# Patient Record
Sex: Female | Born: 1970 | Race: White | Hispanic: No | Marital: Married | Smoking: Former smoker
Health system: Southern US, Community
[De-identification: ages and names within clinical notes are randomized; demographics above are authoritative.]

## PROBLEM LIST (undated history)

## (undated) DIAGNOSIS — I10 Essential (primary) hypertension: Secondary | ICD-10-CM

## (undated) HISTORY — PX: ABLATION: SHX5711

---

## 2000-06-07 ENCOUNTER — Other Ambulatory Visit: Admission: RE | Admit: 2000-06-07 | Discharge: 2000-06-07 | Payer: Self-pay | Admitting: Obstetrics & Gynecology

## 2001-01-03 ENCOUNTER — Inpatient Hospital Stay (HOSPITAL_COMMUNITY): Admission: AD | Admit: 2001-01-03 | Discharge: 2001-01-06 | Payer: Self-pay | Admitting: Obstetrics and Gynecology

## 2004-01-05 ENCOUNTER — Other Ambulatory Visit: Admission: RE | Admit: 2004-01-05 | Discharge: 2004-01-05 | Payer: Self-pay | Admitting: Obstetrics and Gynecology

## 2005-01-18 ENCOUNTER — Other Ambulatory Visit: Admission: RE | Admit: 2005-01-18 | Discharge: 2005-01-18 | Payer: Self-pay | Admitting: Obstetrics and Gynecology

## 2006-03-06 ENCOUNTER — Other Ambulatory Visit: Admission: RE | Admit: 2006-03-06 | Discharge: 2006-03-06 | Payer: Self-pay | Admitting: Obstetrics and Gynecology

## 2007-12-28 ENCOUNTER — Encounter: Admission: RE | Admit: 2007-12-28 | Discharge: 2007-12-28 | Payer: Self-pay | Admitting: Obstetrics and Gynecology

## 2008-04-11 ENCOUNTER — Encounter (INDEPENDENT_AMBULATORY_CARE_PROVIDER_SITE_OTHER): Payer: Self-pay | Admitting: Obstetrics and Gynecology

## 2008-04-11 ENCOUNTER — Ambulatory Visit (HOSPITAL_COMMUNITY): Admission: RE | Admit: 2008-04-11 | Discharge: 2008-04-11 | Payer: Self-pay | Admitting: Obstetrics and Gynecology

## 2011-01-10 ENCOUNTER — Other Ambulatory Visit: Payer: Self-pay | Admitting: Internal Medicine

## 2011-01-10 DIAGNOSIS — Z1231 Encounter for screening mammogram for malignant neoplasm of breast: Secondary | ICD-10-CM

## 2011-01-19 ENCOUNTER — Ambulatory Visit
Admission: RE | Admit: 2011-01-19 | Discharge: 2011-01-19 | Disposition: A | Discharge status: Home / Self Care | Payer: 59 | Source: Ambulatory Visit | Attending: *Deleted | Admitting: *Deleted

## 2011-01-19 DIAGNOSIS — Z1231 Encounter for screening mammogram for malignant neoplasm of breast: Secondary | ICD-10-CM

## 2011-01-21 ENCOUNTER — Other Ambulatory Visit: Payer: Self-pay | Admitting: *Deleted

## 2011-01-21 ENCOUNTER — Other Ambulatory Visit: Payer: Self-pay | Admitting: Internal Medicine

## 2011-01-21 DIAGNOSIS — Z1231 Encounter for screening mammogram for malignant neoplasm of breast: Secondary | ICD-10-CM

## 2011-01-24 ENCOUNTER — Other Ambulatory Visit: Payer: Self-pay | Admitting: Internal Medicine

## 2011-01-24 ENCOUNTER — Other Ambulatory Visit: Payer: Self-pay | Admitting: Family Medicine

## 2011-01-24 DIAGNOSIS — Z1231 Encounter for screening mammogram for malignant neoplasm of breast: Secondary | ICD-10-CM

## 2011-01-25 NOTE — Op Note (Signed)
NAME:  Valerie Burns, Valerie Burns                ACCOUNT NO.:  [id number]   MEDICAL RECORD NO.:  [id number]          PATIENT TYPE:  AMB   LOCATION:  SDC                           FACILITY:  [facility]   PHYSICIAN:  Naima A. Dillard, M.D. DATE OF BIRTH:  1970-10-02   DATE OF PROCEDURE:  04/11/2008  DATE OF DISCHARGE:                               OPERATIVE REPORT   PREOPERATIVE DIAGNOSIS:  Endometrial polyp and menorrhagia.   POSTOPERATIVE DIAGNOSIS:  Endometrial polyp and menorrhagia.   PROCEDURES:  1. Dilation and curettage.  2. Hysteroscopy.  3. Resection of polyp.   SURGEON:  Naima A. Dillard, MD   ASSISTANT:  None.   ANESTHESIA:  General and local.   SPECIMENS:  Endometrial polyp and endometrial curettings sent to  pathology.  Deficit was 315 mL with about 100 mL on the floor.   ESTIMATED BLOOD LOSS:  Minimal.   COMPLICATIONS:  None.   The patient went to PACU in stable condition.   PROCEDURE IN DETAIL:  The patient was taken to the operating room where  she was given general anesthesia, placed in dorsal lithotomy position,  and prepped and draped in a normal sterile fashion.  Straight catheter  was used to drain the bladder.  A bivalve speculum was placed into the  vagina.  The anterior lip of the cervix was grasped with single-tooth  tenaculum.  The cervix was infiltrated with 10 mL of 1% lidocaine with  cervical block.  Cervix was then dilated with Pratt dilators up to 21.  The hysteroscope was placed into the uterine cavity and a large polyp  was seen in the right lower quadrant just below the ostia.  The  patient's right side of the uterus, there were no submucosal fibroids or  other polyps seen.  Polyp forceps were placed into the uterine cavity,  but I was unable to grasp the polyp, so it did dilate the cervix further  with [facility] dilators and the resectoscope was used to remove the polyp in  its entirety without any difficulty, then a sharp curettage was done.  We first used  D5W, then we rinsed the uterine cavity with sorbitol  before the resection, and then after the resection was complete, we re-  rinsed the cavity with D5W.  I then did the ThermaChoice ablation, the  uterus did sound to 10 cm and that was done per protocol without any  difficulty.  Once the ThermaChoice ablation was done, I did look one  more time, there were no  perforations, the entire fundal cavity had been ablated.  There were no  pink areas and the polyp had been completely resected.  Instruments were  removed from the uterus and cervix and vagina with good hemostasis noted  at the tenaculum site.  Sponge, lap, and needle counts were correct.  The patient went to recovery room in stable condition.      Naima A. Normand Sloop, M.D.  Electronically Signed     NAD/MEDQ  D:  04/11/2008  T:  04/12/2008  Job:  [id number]

## 2011-01-25 NOTE — H&P (Signed)
NAME:  Valerie Burns, Valerie Burns                ACCOUNT NO.:  [id number]   MEDICAL RECORD NO.:  [id number]          PATIENT TYPE:  AMB   LOCATION:  SDC                           FACILITY:  [facility]   PHYSICIAN:  Naima A. Dillard, M.D. DATE OF BIRTH:  05-16-1971   DATE OF ADMISSION:  DATE OF DISCHARGE:                              HISTORY & PHYSICAL   CHIEF COMPLAINT:  Menorrhagia and dysmenorrhea.   The patient is a 40 year old, African American female, gravida 1, para  1, who presented to me in April 2009 complaining of heavier cycles that  last longer and more painful.  She said they have been occurring for 6-8  months, and her cycles can last as long as 2 weeks.  She changes a pad  every hour on heavy days and did not have any bleeding disorder.  She  denies being on any contraception.  She has no history of fibroids or  hormone therapy.  She is on no medications.  Denies any menopausal  symptoms, vaginal discharge, abdominal pain or increased stress.  On  ultrasound, the uterus measured 10.2 x 6.4 x 5.29.  There were normal  ovaries.  There were 2 hyperechoic masses, 0.9 x 0.74 and 0.68 x 0.55,  suggestive of endometrial polyps.  The patient has decided to have a D&C  hysteroscopy and also wanted an ablation, seeing that she longer desires  to have childbearing.   The patient has no known drug allergies.   Her medications include Singulair and Advair.   PAST MEDICAL HISTORY:  Significant for asthma.   PAST SURGICAL HISTORY:  Is unremarkable.   SOCIAL HISTORY:  Is negative for tobacco, alcohol and drug use.   FAMILY HISTORY:  Is no GYN cancer.   REVIEW OF SYSTEMS:  GENITOURINARY:  Significant for menorrhagia and  dysmenorrhea.  RESPIRATORY:  Significant for history of asthma.  MUSCULOSKELETAL:  No muscular weakness.  ENDOCRINE:  No thyroid disease.  HEMATOLOGIC:  No history of bleeding disorder.   PHYSICAL EXAMINATION:  The patient weighs 212 pounds.  Blood pressure is  130/80.  She is 5  foot 8 inches.  Pupils are equal.  Hearing is normal.  Throat is clear.  Thyroid is not  enlarged.  HEART:  Has regular rate and rhythm.  LUNGS:  Clear to auscultation bilaterally.  BREASTS:  No masses, discharge, skin changes or nipple retraction.  BACK:  Has no CVA tenderness.  ABDOMEN:  Is nontender without any organomegaly.  EXTREMITIES:  Revealed no cyanosis, clubbing or edema.  NEUROLOGIC EXAM:  Within normal limits.  Vulvar and vaginal exam is normal limits.  Cervix is nontender without  any lesions.  Uterus is top normal size, consistency and nontender.  Adnexa have no masses.   ASSESSMENT:  Menorrhagia and dysmenorrhea.  All treatments were reviewed  for the patient which includes:  1. Observation.  2. Hormonal treatment such as birth control pills.  3. D&C.  4. D&C hysteroscopy.  5. Sonohysterogram, D&C hysteroscopy with or without an ablation,      hysterectomy.   The patient has decided on [facility] hysteroscopy with  ablation.  She  understands the risks are but not limited to bleeding, infection, damage  to internal organs such as bowel, bladder, major blood vessels and  perforation of the uterus.      Naima A. Normand Sloop, M.D.  Electronically Signed     NAD/MEDQ  D:  04/10/2008  T:  04/11/2008  Job:  [id number]

## 2011-01-26 ENCOUNTER — Ambulatory Visit: Payer: Self-pay
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
## 2011-06-10 LAB — CBC
Platelets: 334
RDW: 13.7

## 2011-06-10 LAB — HCG, SERUM, QUALITATIVE: Preg, Serum: NEGATIVE

## 2012-02-10 ENCOUNTER — Other Ambulatory Visit: Payer: Self-pay | Admitting: Internal Medicine

## 2012-02-10 DIAGNOSIS — Z1231 Encounter for screening mammogram for malignant neoplasm of breast: Secondary | ICD-10-CM

## 2012-02-27 ENCOUNTER — Ambulatory Visit
Admission: RE | Admit: 2012-02-27 | Discharge: 2012-02-27 | Disposition: A | Discharge status: Home / Self Care | Payer: Commercial Managed Care - PPO | Source: Ambulatory Visit | Attending: Internal Medicine | Admitting: Internal Medicine

## 2012-02-27 DIAGNOSIS — Z1231 Encounter for screening mammogram for malignant neoplasm of breast: Secondary | ICD-10-CM

## 2012-11-14 ENCOUNTER — Encounter (HOSPITAL_BASED_OUTPATIENT_CLINIC_OR_DEPARTMENT_OTHER): Payer: Self-pay | Admitting: Emergency Medicine

## 2012-11-14 ENCOUNTER — Emergency Department (HOSPITAL_BASED_OUTPATIENT_CLINIC_OR_DEPARTMENT_OTHER)
Admission: EM | Admit: 2012-11-14 | Discharge: 2012-11-14 | Disposition: A | Discharge status: Home / Self Care | Payer: Commercial Managed Care - PPO | Attending: Emergency Medicine | Admitting: Emergency Medicine

## 2012-11-14 ENCOUNTER — Emergency Department (HOSPITAL_BASED_OUTPATIENT_CLINIC_OR_DEPARTMENT_OTHER): Payer: Commercial Managed Care - PPO

## 2012-11-14 DIAGNOSIS — I10 Essential (primary) hypertension: Secondary | ICD-10-CM | POA: Insufficient documentation

## 2012-11-14 DIAGNOSIS — Z79899 Other long term (current) drug therapy: Secondary | ICD-10-CM | POA: Insufficient documentation

## 2012-11-14 DIAGNOSIS — K921 Melena: Secondary | ICD-10-CM | POA: Insufficient documentation

## 2012-11-14 DIAGNOSIS — Z87891 Personal history of nicotine dependence: Secondary | ICD-10-CM | POA: Insufficient documentation

## 2012-11-14 DIAGNOSIS — R197 Diarrhea, unspecified: Secondary | ICD-10-CM | POA: Insufficient documentation

## 2012-11-14 DIAGNOSIS — K625 Hemorrhage of anus and rectum: Secondary | ICD-10-CM | POA: Insufficient documentation

## 2012-11-14 DIAGNOSIS — R1033 Periumbilical pain: Secondary | ICD-10-CM | POA: Insufficient documentation

## 2012-11-14 HISTORY — DX: Essential (primary) hypertension: I10

## 2012-11-14 LAB — CBC WITH DIFFERENTIAL/PLATELET
Eosinophils Absolute: 0 10*3/uL (ref 0.0–0.7)
Lymphocytes Relative: 21 % (ref 12–46)
Lymphs Abs: 2 10*3/uL (ref 0.7–4.0)
Neutro Abs: 6.8 10*3/uL (ref 1.7–7.7)
Neutrophils Relative %: 73 % (ref 43–77)
Platelets: 314 10*3/uL (ref 150–400)
RBC: 4.7 MIL/uL (ref 3.87–5.11)
WBC: 9.3 10*3/uL (ref 4.0–10.5)

## 2012-11-14 LAB — COMPREHENSIVE METABOLIC PANEL
ALT: 30 U/L (ref 0–35)
Alkaline Phosphatase: 65 U/L (ref 39–117)
CO2: 26 mEq/L (ref 19–32)
Chloride: 99 mEq/L (ref 96–112)
GFR calc Af Amer: >90 mL/min (ref >90)
Glucose, Bld: 95 mg/dL (ref 70–99)
Potassium: 3.4 mEq/L — ABNORMAL LOW (ref 3.5–5.1)
Sodium: 136 mEq/L (ref 135–145)
Total Protein: 7.9 g/dL (ref 6.0–8.3)

## 2012-11-14 LAB — URINALYSIS, ROUTINE W REFLEX MICROSCOPIC
Bilirubin Urine: NEGATIVE
Hgb urine dipstick: NEGATIVE
Specific Gravity, Urine: 1.012 (ref 1.005–1.030)
Urobilinogen, UA: 0.2 mg/dL (ref 0.0–1.0)

## 2012-11-14 MED ORDER — ONDANSETRON HCL 4 MG/2ML IJ SOLN
4.0000 mg | Freq: Once | INTRAMUSCULAR | Status: AC
  Administered 2012-11-14: 4 mg via INTRAVENOUS
  Filled 2012-11-14: qty 2

## 2012-11-14 MED ORDER — SODIUM CHLORIDE 0.9 % IV BOLUS (SEPSIS)
500.0000 mL | Freq: Once | INTRAVENOUS | Status: AC
  Administered 2012-11-14: 500 mL via INTRAVENOUS

## 2012-11-14 MED ORDER — SODIUM CHLORIDE 0.9 % IV SOLN
INTRAVENOUS | Status: DC
  Administered 2012-11-14: 100 mL/h via INTRAVENOUS

## 2012-11-14 MED ORDER — IOHEXOL 300 MG/ML  SOLN
100.0000 mL | Freq: Once | INTRAMUSCULAR | Status: AC | PRN
  Administered 2012-11-14: 100 mL via INTRAVENOUS

## 2012-11-14 MED ORDER — LOPERAMIDE HCL 2 MG PO TABS: 2.0000 mg | ORAL_TABLET | Freq: Four times a day (QID) | ORAL | Status: AC | PRN

## 2012-11-14 MED ORDER — IOHEXOL 300 MG/ML  SOLN
50.0000 mL | Freq: Once | INTRAMUSCULAR | Status: AC | PRN
  Administered 2012-11-14: 50 mL via ORAL

## 2012-11-14 MED ORDER — HYDROCODONE-ACETAMINOPHEN 5-325 MG PO TABS: 1.0000 | ORAL_TABLET | Freq: Four times a day (QID) | ORAL | Status: AC | PRN

## 2012-11-14 NOTE — ED Provider Notes (Signed)
History     CSN: [id number]  Arrival date & time 11/14/12  0915   First MD Initiated Contact with Patient 11/14/12 1012      Chief Complaint  Patient presents with  . Abdominal Pain  . Rectal Bleeding    (Consider location/radiation/quality/duration/timing/severity/associated sxs/prior treatment) Patient is a 42 y.o. female presenting with abdominal pain and hematochezia. The history is provided by the patient.  Abdominal Pain Associated symptoms: diarrhea and hematochezia   Associated symptoms: no chest pain, no cough, no dysuria, no fever, no nausea, no shortness of breath and no vomiting   Rectal Bleeding  Associated symptoms include abdominal pain and diarrhea. Pertinent negatives include no fever, no nausea, no vomiting, no chest pain, no headaches, no coughing and no rash.   Patient with onset of periumbilical abdominal pain at 6:30 PM yesterday. Described as a crampy pain associated with diarrhea. No vomiting no nausea. The bowel movements started to have blood clots in them at about 4:00 AM this morning. Had 2 bowel movements with clots. The colon was never turned completely red. Patient primary care doctor is [facility]. No history of similar symptoms. No history of rectal bleeding. Periumbilical abdominal pain is crampy and nonradiating it's about 5/10.  Past Medical History  Diagnosis Date  . Hypertension     Past Surgical History  Procedure Laterality Date  . Ablation      Uterine    History reviewed. No pertinent family history.  History  Substance Use Topics  . Smoking status: Former Games developer  . Smokeless tobacco: Not on file  . Alcohol Use: No    OB History   Grav Para Term Preterm Abortions TAB SAB Ect Mult Living                  Review of Systems  Constitutional: Negative for fever.  HENT: Negative for congestion and neck pain.   Eyes: Negative for redness and visual disturbance.  Respiratory: Negative for cough and shortness of breath.    Cardiovascular: Negative for chest pain.  Gastrointestinal: Positive for abdominal pain, diarrhea, blood in stool and hematochezia. Negative for nausea and vomiting.  Genitourinary: Negative for dysuria.  Musculoskeletal: Negative for back pain.  Skin: Negative for rash.  Neurological: Negative for dizziness, light-headedness and headaches.  Hematological: Does not bruise/bleed easily.    Allergies  Review of patient's allergies indicates no known allergies.  Home Medications   Current Outpatient Rx  Name  Route  Sig  Dispense  Refill  . losartan-hydrochlorothiazide (HYZAAR) 50-12.5 MG per tablet   Oral   Take 1 tablet by mouth daily.         Marland Kitchen PARoxetine (PAXIL) 20 MG tablet   Oral   Take 20 mg by mouth every morning.         Marland Kitchen HYDROcodone-acetaminophen (NORCO/VICODIN) 5-325 MG per tablet   Oral   Take 1-2 tablets by mouth every 6 (six) hours as needed for pain.   15 tablet   0   . loperamide (IMODIUM A-D) 2 MG tablet   Oral   Take 1 tablet (2 mg total) by mouth 4 (four) times daily as needed for diarrhea or loose stools.   30 tablet   0     There were no vitals taken for this visit.  Physical Exam  Nursing note and vitals reviewed. Constitutional: She is oriented to person, place, and time. She appears well-developed and well-nourished. No distress.  HENT:  Head: Normocephalic and atraumatic.  Mouth/Throat: Oropharynx  is clear and moist.  Eyes: Conjunctivae and EOM are normal. Pupils are equal, round, and reactive to light.  Neck: Normal range of motion. Neck supple.  Cardiovascular: Normal rate, regular rhythm, normal heart sounds and intact distal pulses.   No murmur heard. Pulmonary/Chest: Effort normal and breath sounds normal.  Abdominal: Soft. Bowel sounds are normal. There is no tenderness.  Musculoskeletal: Normal range of motion. She exhibits no edema.  Neurological: She is alert and oriented to person, place, and time. No cranial nerve deficit.  She exhibits normal muscle tone. Coordination normal.  Skin: Skin is warm and dry. No rash noted.    ED Course  Procedures (including critical care time)  Labs Reviewed  COMPREHENSIVE METABOLIC PANEL - Abnormal; Notable for the following:    Potassium 3.4 (*)    All other components within normal limits  CBC WITH DIFFERENTIAL  LIPASE, BLOOD  URINALYSIS, ROUTINE W REFLEX MICROSCOPIC   Ct Abdomen Pelvis W Contrast  11/14/2012  *RADIOLOGY REPORT*  Clinical Data: Abdominal pain, diarrhea, rectal bleeding  CT ABDOMEN AND PELVIS WITH CONTRAST  Technique:  Multidetector CT imaging of the abdomen and pelvis was performed following the standard protocol during bolus administration of intravenous contrast.  Contrast: 100 ml Omnipaque-300 IV  Comparison: None.  Findings: Lung bases are clear.  Liver, spleen, pancreas, and adrenal glands are within normal limits.  Gallbladder is unremarkable.  No intrahepatic or extrahepatic ductal dilatation.  Kidneys are within normal limits.  No hydronephrosis.  No evidence of bowel obstruction.  Normal appendix.  Left colon is decompressed.  No evidence of abdominal aortic aneurysm.  No abdominopelvic ascites.  No suspicious abdominopelvic lymphadenopathy.  Uterus is mildly heterogeneous.  Bilateral ovaries are unremarkable.  Bladder is within normal limits.  Visualized osseous structures are within normal limits.  IMPRESSION: No evidence of bowel obstruction.  Normal appendix.  No colonic wall thickening or inflammatory changes.  No CT findings to account for the patient's abdominal pain.   Original Report Authenticated By: Charline Bills, M.D.    Results for orders placed during the hospital encounter of 11/14/12  CBC WITH DIFFERENTIAL      Result Value Range   WBC 9.3  4.0 - 10.5 K/uL   RBC 4.70  3.87 - 5.11 MIL/uL   Hemoglobin 14.4  12.0 - 15.0 g/dL   HCT 98.1  19.1 - 47.8 %   MCV 88.1  78.0 - 100.0 fL   MCH 30.6  26.0 - 34.0 pg   MCHC 34.8  30.0 - 36.0 g/dL    RDW 29.5  62.1 - 30.8 %   Platelets 314  150 - 400 K/uL   Neutrophils Relative 73  43 - 77 %   Neutro Abs 6.8  1.7 - 7.7 K/uL   Lymphocytes Relative 21  12 - 46 %   Lymphs Abs 2.0  0.7 - 4.0 K/uL   Monocytes Relative 6  3 - 12 %   Monocytes Absolute 0.5  0.1 - 1.0 K/uL   Eosinophils Relative 0  0 - 5 %   Eosinophils Absolute 0.0  0.0 - 0.7 K/uL   Basophils Relative 0  0 - 1 %   Basophils Absolute 0.0  0.0 - 0.1 K/uL  COMPREHENSIVE METABOLIC PANEL      Result Value Range   Sodium 136  135 - 145 mEq/L   Potassium 3.4 (*) 3.5 - 5.1 mEq/L   Chloride 99  96 - 112 mEq/L   CO2 26  19 -  32 mEq/L   Glucose, Bld 95  70 - 99 mg/dL   BUN 10  6 - 23 mg/dL   Creatinine, Ser 1.61  0.50 - 1.10 mg/dL   Calcium 9.4  8.4 - 09.6 mg/dL   Total Protein 7.9  6.0 - 8.3 g/dL   Albumin 4.1  3.5 - 5.2 g/dL   AST 21  0 - 37 U/L   ALT 30  0 - 35 U/L   Alkaline Phosphatase 65  39 - 117 U/L   Total Bilirubin 0.4  0.3 - 1.2 mg/dL   GFR calc non Af Amer >90  >90 mL/min   GFR calc Af Amer >90  >90 mL/min  LIPASE, BLOOD      Result Value Range   Lipase 20  11 - 59 U/L  URINALYSIS, ROUTINE W REFLEX MICROSCOPIC      Result Value Range   Color, Urine YELLOW  YELLOW   APPearance CLEAR  CLEAR   Specific Gravity, Urine 1.012  1.005 - 1.030   pH 6.0  5.0 - 8.0   Glucose, UA NEGATIVE  NEGATIVE mg/dL   Hgb urine dipstick NEGATIVE  NEGATIVE   Bilirubin Urine NEGATIVE  NEGATIVE   Ketones, ur NEGATIVE  NEGATIVE mg/dL   Protein, ur NEGATIVE  NEGATIVE mg/dL   Urobilinogen, UA 0.2  0.0 - 1.0 mg/dL   Nitrite NEGATIVE  NEGATIVE   Leukocytes, UA NEGATIVE  NEGATIVE     1. Abdominal pain   2. Rectal bleeding       MDM   Patient with new onset of rectal bleeding. No significant blood loss vital signs normal. Hemoglobin and hematocrit normal. Platelet count is normal. Liver function test are normal electrolytes are normal. Patient's CAT scan of the abdomen without any specific findings to explain the rectal  bleeding with abdominal pain. Will treat with antidiarrhea medicine and pain medicine patient is followed by [facility]. Patient with no further bloody bowel movements in the emergency department., She had just had the bloody clots along with the stool.        Shelda Jakes, MD 11/14/12 1314

## 2012-11-14 NOTE — ED Notes (Signed)
Pt states that pain started last night with diarrhea. Pt noticed blood in the absence of stool from rectum. No blood in underpants. Pain is periumbilical.

## 2013-03-12 ENCOUNTER — Other Ambulatory Visit: Payer: Self-pay

## 2013-03-12 DIAGNOSIS — Z1231 Encounter for screening mammogram for malignant neoplasm of breast: Secondary | ICD-10-CM

## 2013-03-28 ENCOUNTER — Ambulatory Visit
Admission: RE | Admit: 2013-03-28 | Discharge: 2013-03-28 | Disposition: A | Discharge status: Home / Self Care | Payer: Commercial Managed Care - PPO | Source: Ambulatory Visit

## 2013-03-28 DIAGNOSIS — Z1231 Encounter for screening mammogram for malignant neoplasm of breast: Secondary | ICD-10-CM

## 2013-06-04 ENCOUNTER — Other Ambulatory Visit: Payer: Self-pay

## 2013-06-04 ENCOUNTER — Other Ambulatory Visit: Payer: Self-pay | Admitting: Internal Medicine

## 2013-06-04 DIAGNOSIS — N63 Unspecified lump in unspecified breast: Secondary | ICD-10-CM

## 2014-06-13 ENCOUNTER — Other Ambulatory Visit: Payer: Self-pay

## 2014-06-13 DIAGNOSIS — Z1231 Encounter for screening mammogram for malignant neoplasm of breast: Secondary | ICD-10-CM

## 2014-08-05 ENCOUNTER — Ambulatory Visit
Admission: RE | Admit: 2014-08-05 | Discharge: 2014-08-05 | Disposition: A | Discharge status: Home / Self Care | Payer: Commercial Managed Care - PPO | Source: Ambulatory Visit

## 2014-08-05 DIAGNOSIS — Z1231 Encounter for screening mammogram for malignant neoplasm of breast: Secondary | ICD-10-CM

## 2014-08-15 IMAGING — MG MM DIGITAL SCREENING BILAT
5 series · 5 of 5 positions shown · non-contrast
Comparison: 12/28/2007

CLINICAL DATA: Screening.

DIGITAL SCREENING BILATERAL MAMMOGRAM WITH CAD

[R CC]
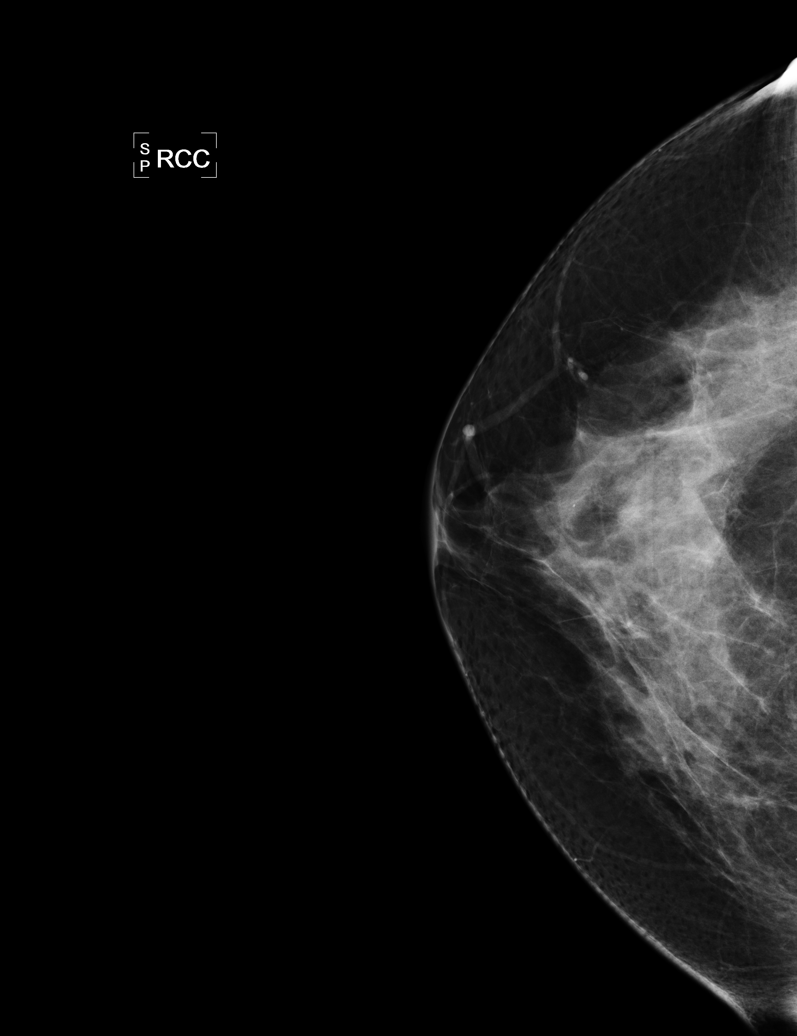

[L CC]
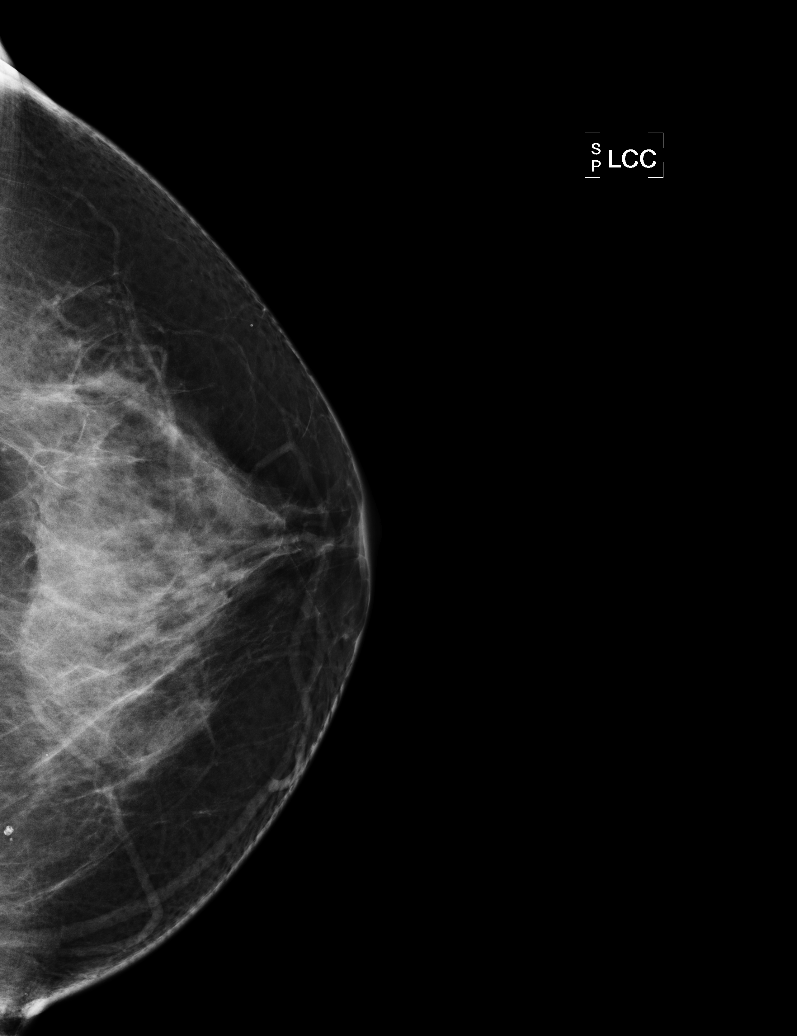

[L MLO (1 of 2)]
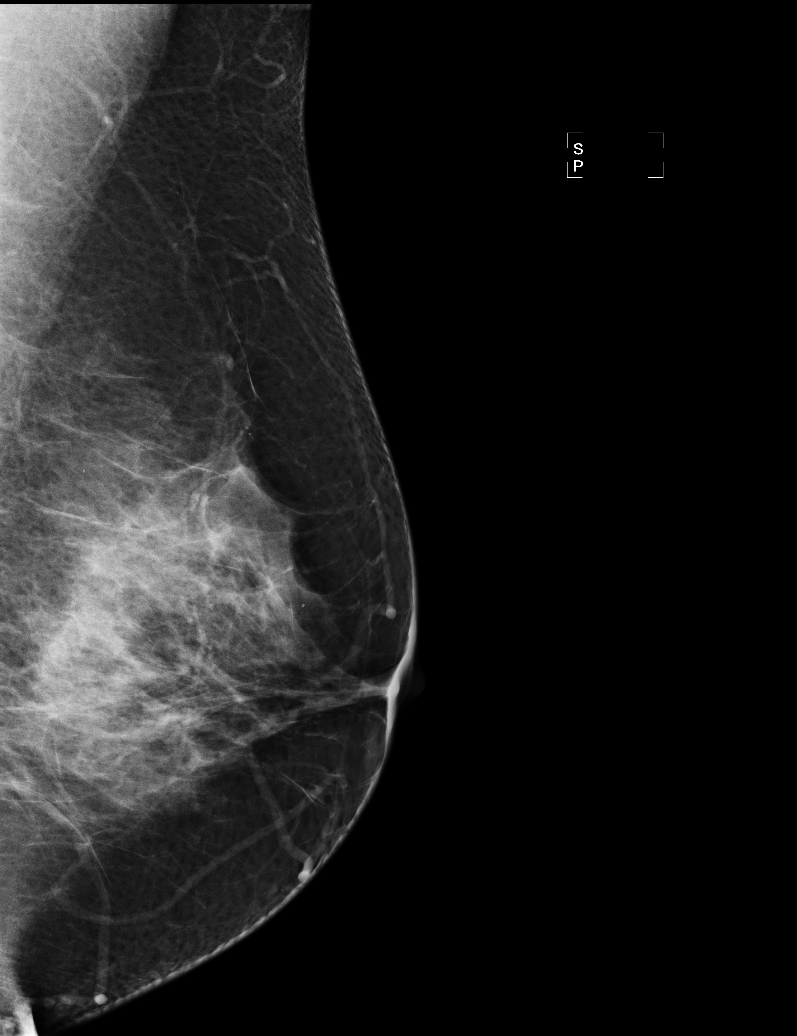

[R MLO]
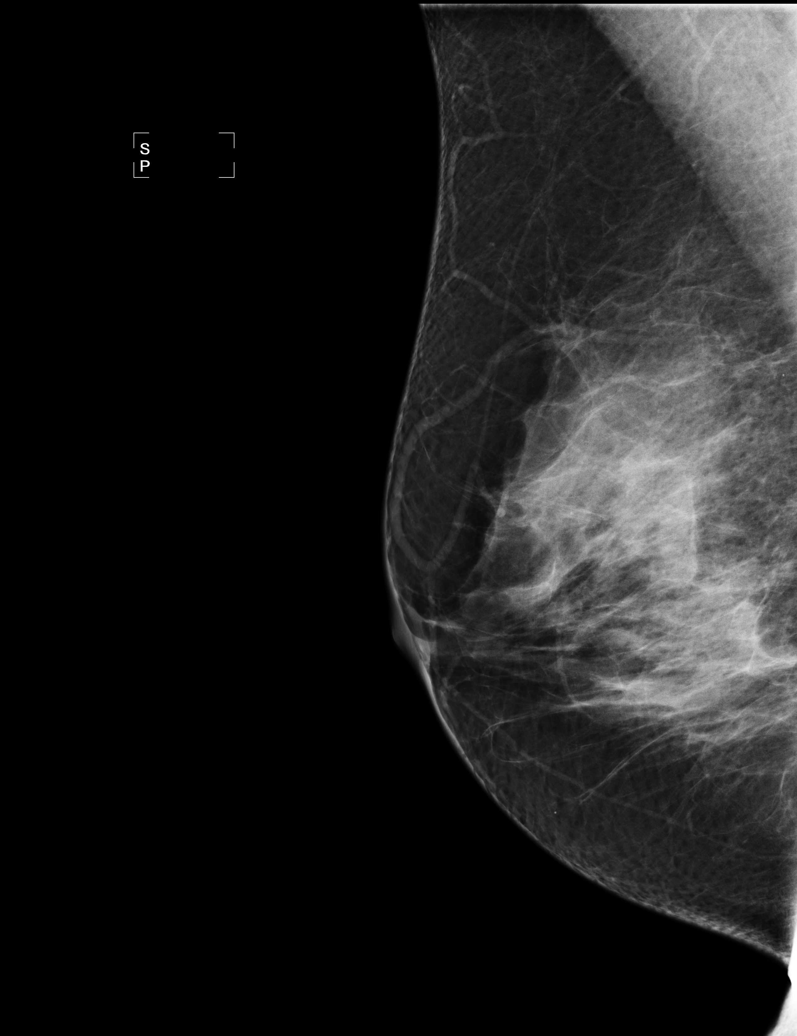

[L MLO (2 of 2)]
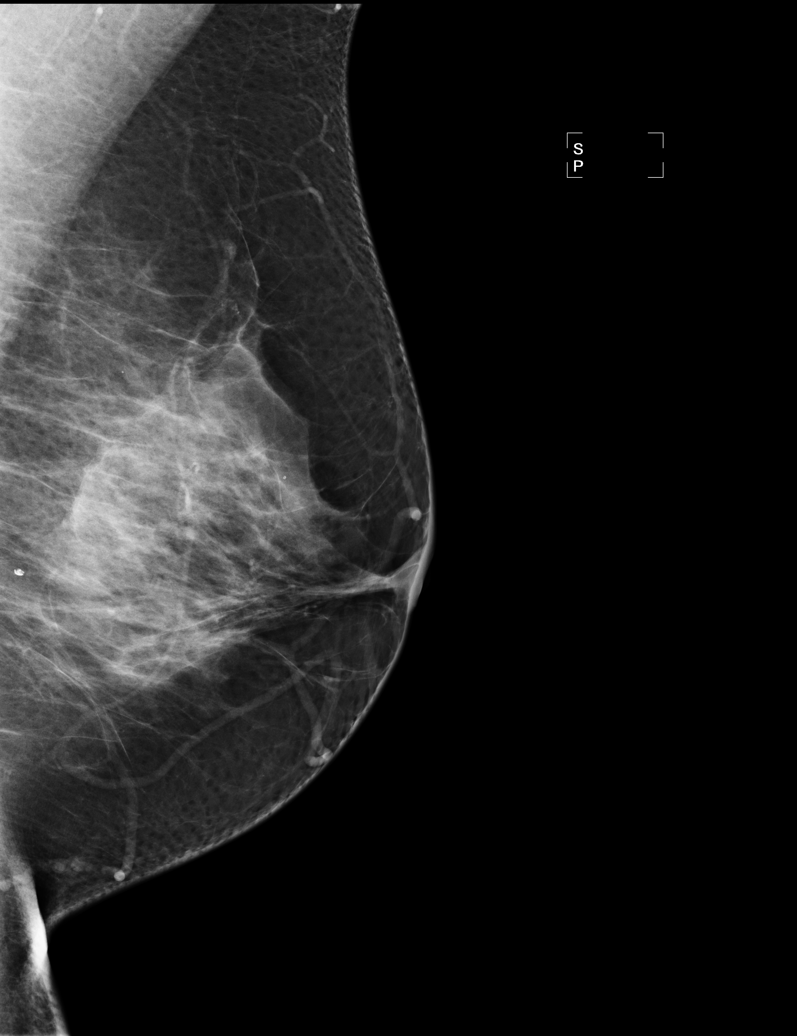

[5 of 5 positions shown; findings below may reference images not displayed]

FINDINGS: ACR Breast Density Category c: The breasts are heterogeneously
dense, which may obscure small masses.

There is no suspicious dominant mass, architectural distortion, or
calcification to suggest malignancy.

Images were processed with CAD.
IMPRESSION: No mammographic evidence of malignancy.

A result letter of this screening mammogram will be mailed directly
to the patient.

RECOMMENDATION:
Screening mammogram in one year. (Code:6I-L-RIX)

BI-RADS CATEGORY 1:  Negative.

## 2015-11-13 ENCOUNTER — Other Ambulatory Visit: Payer: Self-pay

## 2015-11-13 DIAGNOSIS — Z1231 Encounter for screening mammogram for malignant neoplasm of breast: Secondary | ICD-10-CM

## 2015-12-17 ENCOUNTER — Ambulatory Visit: Payer: Commercial Managed Care - PPO

## 2016-03-08 ENCOUNTER — Ambulatory Visit: Payer: Commercial Managed Care - PPO

## 2016-03-08 ENCOUNTER — Ambulatory Visit
Admission: RE | Admit: 2016-03-08 | Discharge: 2016-03-08 | Disposition: A | Discharge status: Home / Self Care | Payer: Commercial Managed Care - PPO | Source: Ambulatory Visit

## 2016-03-08 DIAGNOSIS — Z1231 Encounter for screening mammogram for malignant neoplasm of breast: Secondary | ICD-10-CM

## 2018-02-06 ENCOUNTER — Other Ambulatory Visit: Payer: Self-pay | Admitting: General Practice

## 2018-02-06 DIAGNOSIS — Z1231 Encounter for screening mammogram for malignant neoplasm of breast: Secondary | ICD-10-CM

## 2018-02-27 ENCOUNTER — Ambulatory Visit: Payer: Commercial Managed Care - PPO

## 2018-03-28 ENCOUNTER — Ambulatory Visit: Payer: Commercial Managed Care - PPO

## 2022-12-14 ENCOUNTER — Ambulatory Visit: Payer: 59

## 2023-01-11 ENCOUNTER — Other Ambulatory Visit: Payer: Self-pay

## 2023-01-11 ENCOUNTER — Ambulatory Visit: Payer: Managed Care, Other (non HMO) | Attending: Physician Assistant | Admitting: Physical Therapy

## 2023-01-11 ENCOUNTER — Encounter: Payer: Self-pay | Admitting: Physical Therapy

## 2023-01-11 DIAGNOSIS — R279 Unspecified lack of coordination: Secondary | ICD-10-CM | POA: Diagnosis present

## 2023-01-11 DIAGNOSIS — M6281 Muscle weakness (generalized): Secondary | ICD-10-CM

## 2023-01-11 DIAGNOSIS — R293 Abnormal posture: Secondary | ICD-10-CM | POA: Insufficient documentation

## 2023-01-11 NOTE — Therapy (Addendum)
OUTPATIENT PHYSICAL THERAPY FEMALE PELVIC EVALUATION   Patient Name: Valerie Burns MRN: [id number] DOB:April 28, 1971, 52 y.o., female Today's Date: 01/11/2023  END OF SESSION:  PT End of Session - 01/11/23 1607     Visit Number 1    Date for PT Re-Evaluation 04/13/23    Authorization Type cigna    PT Start Time 1608    PT Stop Time 1640    PT Time Calculation (min) 32 min    Activity Tolerance Patient tolerated treatment well    Behavior During Therapy WFL for tasks assessed/performed             Past Medical History:  Diagnosis Date   Hypertension    Past Surgical History:  Procedure Laterality Date   ABLATION     Uterine   There are no problems to display for this patient.   PCP: Larkin Ina  REFERRING PROVIDER: Larkin Ina  REFERRING DIAG: N39.3 (ICD-10-CM) - Stress incontinence (female) (female)  THERAPY DIAG:  Muscle weakness (generalized)  Abnormal posture  Unspecified lack of coordination  Rationale for Evaluation and Treatment: Rehabilitation  ONSET DATE: 22 years ago  SUBJECTIVE:                                                                                                                                                                                           SUBJECTIVE STATEMENT: Starting have incontinence after birth of son 22 years ago and now worsening to with walking/hiking. Had weight loss surgery and now walking more and noticing more.   Fluid intake: Yes: water -     PAIN:  Are you having pain? No  PRECAUTIONS: None  WEIGHT BEARING RESTRICTIONS: No  FALLS:  Has patient fallen in last 6 months? 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   PLOF: Independent  PATIENT GOALS: to have less leakage  PERTINENT HISTORY:  gastric bypass, Cystocele, DDD, Carpal tunnel syndrome,, hypertension, hysterectomy, Internal hemorrhoid,  Sexual  abuse: No  BOWEL MOVEMENT: Pain with bowel movement: No Type of bowel movement:Type (Bristol Stool Scale) 4, Frequency daily, and Strain No Fully empty rectum: Yes:   Leakage: No Pads: No Fiber supplement: No  URINATION: Pain with urination: No Fully empty bladder: Yes: but will stand sometimes and have leakage Stream: Strong Urgency: No Frequency: not quick than every two hours, rarely up at night Leakage: Coughing, Sneezing, Exercise, and walking/hiking Pads: Yes: daily  INTERCOURSE: Pain with intercourse:  not painful  Ability to have vaginal penetration:  Yes:   Climax: not painful  Marinoff Scale:  0/3  PREGNANCY: Vaginal deliveries 1 Tearing Yes: with son but unsure of grade C-section deliveries 0 Currently pregnant No  PROLAPSE: None   OBJECTIVE:   DIAGNOSTIC FINDINGS:   COGNITION: Overall cognitive status: Within functional limits for tasks assessed     SENSATION: Light touch: Appears intact Proprioception: Appears intact  MUSCLE LENGTH: Bil hamstrings and adductors are limited by 25%  POSTURE: rounded shoulders and posterior pelvic tilt  PELVIC ALIGNMENT:WFL  LUMBARAROM/PROM:  A/PROM A/PROM  eval  Flexion WFL  Extension WFL  Right lateral flexion Limited by 25%  Left lateral flexion Limited by 25%  Right rotation Limited by 25%  Left rotation Limited by 25%   (Blank rows = not tested)  LOWER EXTREMITY ROM:  WFL  LOWER EXTREMITY MMT:  Hips grossly 4/5, knees 5/5 PALPATION:   General  no TTP                External Perineal Exam no TTP                             Internal Pelvic Floor no TTP  Patient confirms identification and approves PT to assess internal pelvic floor and treatment Yes  PELVIC MMT:   MMT eval  Vaginal 3/5, with attempt to exhale initially 2/5 but returned to 3/5; 8s; 7 reps  Internal Anal Sphincter   External Anal Sphincter   Puborectalis   Diastasis Recti   (Blank rows = not tested)         TONE: WFL  PROLAPSE: Not seen in hooklying   TODAY'S TREATMENT:                                                                                                                              DATE:   01/11/23 EVAL Examination completed, findings reviewed, pt educated on POC, HEP. Pt motivated to participate in PT and agreeable to attempt recommendations.     PATIENT EDUCATION:  Education details: [id number] Person educated: Patient Education method: Explanation, Demonstration, Tactile cues, Verbal cues, and Handouts Education comprehension: verbalized understanding and returned demonstration  HOME EXERCISE PROGRAM: [id number]  ASSESSMENT:  CLINICAL IMPRESSION: Patient is a 51 y.o. female  who was seen today for physical therapy evaluation and treatment for urinary incontinence. Pt has leakage with stressors of sneezing/laughing/coughing and with walking, hiking. Pt reports she has had this since birth of sons over 20 years ago but has worsened since she has become more active since weight lose surgery. Pt found to have decreased strength in core and bil hips mildly, decreased flexibility in spine and bil hips. Patient consented to internal pelvic floor assessment vaginally this date and found to have decreased strength, endurance, and coordination. Patient benefited from verbal cues for improved technique with pelvic floor contractions and breathing mechanics. Pt tolerated well and denied pain throughout. Pt would benefit from additional PT to further address deficits.  OBJECTIVE IMPAIRMENTS: decreased coordination, decreased endurance, decreased mobility, impaired flexibility, improper body mechanics, and postural dysfunction.   ACTIVITY LIMITATIONS: continence  PARTICIPATION LIMITATIONS: community activity  PERSONAL FACTORS: Time since onset of injury/illness/exacerbation and 1 comorbidity: one vaginal birth with tearing  are also affecting patient's functional outcome.   REHAB  POTENTIAL: Good  CLINICAL DECISION MAKING: Stable/uncomplicated  EVALUATION COMPLEXITY: Low   GOALS: Goals reviewed with patient? Yes  SHORT TERM GOALS: Target date: 02/08/23  Pt to be I with HEP.  Baseline: Goal status: INITIAL  2.  Pt to demonstrate at least 3/5 pelvic floor strength for improved pelvic stability and decreased strain at pelvic floor/ decrease leakage.  Baseline:  Goal status: INITIAL  3.  Pt to demonstrate improved coordination of pelvic floor and breathing mechanics with squat with appropriate synergistic patterns to decrease pain and leakage at least 50% of the time.    Baseline:  Goal status: INITIAL  LONG TERM GOALS: Target date: 04/13/23  Pt to be I with advanced HEP.  Baseline:  Goal status: INITIAL  2.  Pt to demonstrate at least 4/5 pelvic floor strength for improved pelvic stability and decreased strain at pelvic floor/ decrease leakage.  Baseline:  Goal status: INITIAL  3.  Pt will have to use 3 pads per week for improved QOL.   Baseline:  Goal status: INITIAL  4.  Pt to demonstrate improved coordination of pelvic floor and breathing mechanics with 10# squat with appropriate synergistic patterns to decrease pain and leakage at least 75% of the time.    Baseline:  Goal status: INITIAL  5.  Pt to demonstrate at least 5/5 bil hip strength for improved pelvic stability and functional squats without leakage.  Baseline:  Goal status: INITIAL   PLAN:  PT FREQUENCY: 1x/week  PT DURATION:  8 sessions  PLANNED INTERVENTIONS: Therapeutic exercises, Therapeutic activity, Neuromuscular re-education, Balance training, Patient/Family education, Self Care, Joint mobilization, Aquatic Therapy, Dry Needling, Electrical stimulation, Spinal mobilization, Cryotherapy, Moist heat, scar mobilization, Taping, Biofeedback, and Manual therapy  PLAN FOR NEXT SESSION: strengthening core/hips/pelvic floor, coordination pelvic floor and breathing with strengthening  exercises  Otelia Sergeant, PT, DPT 05/01/244:44 PM   PHYSICAL THERAPY DISCHARGE SUMMARY  Visits from Start of Care: `  Current functional level related to goals / functional outcomes: Unable to formally reassess, pt unable to complete future appointments at this time   Remaining deficits: Unable to formally reassess    Education / Equipment: HEP   Patient agrees to discharge. Patient goals were not met. Patient is being discharged due to  at pt's request, unable to complete further appointments at this time.  Otelia Sergeant, PT, DPT 02/14/2409:41 AM

## 2023-02-16 ENCOUNTER — Ambulatory Visit: Payer: Managed Care, Other (non HMO) | Admitting: Physical Therapy

## 2023-02-23 ENCOUNTER — Encounter: Payer: 59 | Admitting: Physical Therapy

## 2023-03-02 ENCOUNTER — Encounter: Payer: 59 | Admitting: Physical Therapy

## 2023-03-09 ENCOUNTER — Encounter: Payer: 59 | Admitting: Physical Therapy

## 2023-03-22 ENCOUNTER — Encounter: Payer: 59 | Admitting: Physical Therapy

## 2023-03-30 ENCOUNTER — Encounter: Payer: 59 | Admitting: Physical Therapy

## 2023-04-06 ENCOUNTER — Encounter: Payer: 59 | Admitting: Physical Therapy

## 2023-04-13 ENCOUNTER — Encounter: Payer: 59 | Admitting: Physical Therapy
